# Patient Record
Sex: Female | Born: 1995 | Race: Black or African American | Hispanic: No
Health system: Southern US, Community
[De-identification: ages and names within clinical notes are randomized; demographics above are authoritative.]

---

## 2020-07-16 ENCOUNTER — Other Ambulatory Visit: Payer: Self-pay

## 2020-07-16 ENCOUNTER — Emergency Department (HOSPITAL_COMMUNITY)
Admission: EM | Admit: 2020-07-16 | Discharge: 2020-07-16 | Disposition: A | Discharge status: Home / Self Care | Payer: No Typology Code available for payment source | Attending: Emergency Medicine | Admitting: Emergency Medicine

## 2020-07-16 ENCOUNTER — Emergency Department (HOSPITAL_COMMUNITY): Payer: No Typology Code available for payment source

## 2020-07-16 DIAGNOSIS — J3489 Other specified disorders of nose and nasal sinuses: Secondary | ICD-10-CM | POA: Diagnosis not present

## 2020-07-16 DIAGNOSIS — R6883 Chills (without fever): Secondary | ICD-10-CM | POA: Insufficient documentation

## 2020-07-16 DIAGNOSIS — R531 Weakness: Secondary | ICD-10-CM | POA: Insufficient documentation

## 2020-07-16 DIAGNOSIS — R202 Paresthesia of skin: Secondary | ICD-10-CM | POA: Insufficient documentation

## 2020-07-16 DIAGNOSIS — R519 Headache, unspecified: Secondary | ICD-10-CM | POA: Diagnosis not present

## 2020-07-16 LAB — I-STAT CHEM 8, ED
BUN: 10 mg/dL (ref 6–20)
Calcium, Ion: 1.26 mmol/L (ref 1.15–1.40)
Chloride: 103 mmol/L (ref 98–111)
Creatinine, Ser: 0.7 mg/dL (ref 0.44–1.00)
Glucose, Bld: 89 mg/dL (ref 70–99)
HCT: 42 % (ref 36.0–46.0)
Hemoglobin: 14.3 g/dL (ref 12.0–15.0)
Potassium: 4 mmol/L (ref 3.5–5.1)
Sodium: 140 mmol/L (ref 135–145)
TCO2: 24 mmol/L (ref 22–32)

## 2020-07-16 LAB — I-STAT BETA HCG BLOOD, ED (MC, WL, AP ONLY): I-stat hCG, quantitative: <5 m[IU]/mL (ref <5)

## 2020-07-16 MED ORDER — DIPHENHYDRAMINE HCL 50 MG/ML IJ SOLN
12.5000 mg | Freq: Once | INTRAMUSCULAR | Status: AC
  Administered 2020-07-16: 12.5 mg via INTRAVENOUS
  Filled 2020-07-16: qty 1

## 2020-07-16 MED ORDER — METOCLOPRAMIDE HCL 5 MG/ML IJ SOLN
10.0000 mg | Freq: Once | INTRAMUSCULAR | Status: AC
  Administered 2020-07-16: 10 mg via INTRAVENOUS
  Filled 2020-07-16: qty 2

## 2020-07-16 MED ORDER — DEXAMETHASONE SODIUM PHOSPHATE 10 MG/ML IJ SOLN
10.0000 mg | Freq: Once | INTRAMUSCULAR | Status: AC
  Administered 2020-07-16: 10 mg via INTRAVENOUS
  Filled 2020-07-16: qty 1

## 2020-07-16 MED ORDER — SODIUM CHLORIDE 0.9 % IV BOLUS
1000.0000 mL | Freq: Once | INTRAVENOUS | Status: AC
  Administered 2020-07-16: 1000 mL via INTRAVENOUS

## 2020-07-16 MED ORDER — KETOROLAC TROMETHAMINE 15 MG/ML IJ SOLN
15.0000 mg | Freq: Once | INTRAMUSCULAR | Status: AC
  Administered 2020-07-16: 15 mg via INTRAVENOUS
  Filled 2020-07-16: qty 1

## 2020-07-16 NOTE — ED Notes (Signed)
Patient transported to CT 

## 2020-07-16 NOTE — ED Triage Notes (Addendum)
Per ems pt was at home and has been having bad headache that was behind the left eye. Pt said has had headahes before. Pt said light sensitivity. Pt is nauseated too. Pt said some tingling in both hands

## 2020-07-16 NOTE — Discharge Instructions (Signed)
Please establish care with a primary doctor in the area Return to the ER if you have any worsening symptoms

## 2020-07-16 NOTE — ED Notes (Signed)
Pt. Returned from [place] with her pain in her stomach hurting.

## 2020-07-16 NOTE — ED Provider Notes (Signed)
[facility] EMERGENCY DEPARTMENT Provider Note   CSN: [id number] Arrival date & time: 07/16/20  2683     History Chief Complaint  Patient presents with  . Migraine    Angela Holder is a 24 y.o. female who presents with a headache and R arm weakness and tingling. She states that she has a long history of headaches - she has about 5 a month and usually it's worse with hormonal changes. Her headache woke her up from sleep at 3AM this morning. It's behind her L eye which is typical. She will start to have chills and feel hot. Then she started to see spots in her vision. Her L eye was tearing and she had a runny nose. The new symptom that she had which prompted her to come to the ER was she started to have paresthesias in the finger tips. She called EMS and the tingling progressively worsened and started going up her R arm which scared her. It lasted about an hour and then started to get better. Since she was waiting she had an episode of vomiting which actually made her headache better. Usually she takes Ibuprofen for her headaches but it doesn't help much. She has several family members who suffer from migraines. She is from [place] but going to [employer] for college. She has not seen a neurologist or been formally diagnosed with migraines.  HPI     No past medical history on file.  There are no problems to display for this patient.   The histories are not reviewed yet. Please review them in the "History" navigator section and refresh this SmartLink.   OB History   No obstetric history on file.     No family history on file.  Social History   Tobacco Use  . Smoking status: Not on file  Substance Use Topics  . Alcohol use: Not on file  . Drug use: Not on file    Home Medications Prior to Admission medications   Not on File    Allergies    Patient has no allergy information on record.  Review of Systems   Review of Systems  Constitutional: Positive for  chills. Negative for fever.  HENT: Positive for rhinorrhea. Negative for ear discharge.   Eyes: Positive for discharge.  Neurological: Positive for weakness, numbness and headaches. Negative for dizziness, syncope and facial asymmetry.  All other systems reviewed and are negative.   Physical Exam Updated Vital Signs BP 105/64 (BP Location: Left Arm)   Pulse 76   Temp 98.7 F (37.1 C) (Oral)   Resp 10   SpO2 100%   Physical Exam Vitals and nursing note reviewed.  Constitutional:      General: She is not in acute distress.    Appearance: Normal appearance. She is well-developed. She is not ill-appearing.     Comments: Alert, calm, cooperative. NAD  HENT:     Head: Normocephalic and atraumatic.  Eyes:     General: No scleral icterus.       Right eye: No discharge.        Left eye: No discharge.     Conjunctiva/sclera: Conjunctivae normal.     Pupils: Pupils are equal, round, and reactive to light.  Cardiovascular:     Rate and Rhythm: Normal rate.  Pulmonary:     Effort: Pulmonary effort is normal. No respiratory distress.  Abdominal:     General: There is no distension.  Musculoskeletal:     Cervical back: Normal  range of motion.  Skin:    General: Skin is warm and dry.  Neurological:     Mental Status: She is alert and oriented to person, place, and time.     Comments: Mental Status:  Alert, oriented, thought content appropriate, able to give a coherent history. Speech fluent without evidence of aphasia. Able to follow 2 step commands without difficulty.  Cranial Nerves:  II:  Peripheral visual fields grossly normal, pupils equal, round, reactive to light III,IV, VI: ptosis not present, extra-ocular motions intact bilaterally  V,VII: smile symmetric, facial light touch sensation equal VIII: hearing grossly normal to voice  X: uvula elevates symmetrically  XI: bilateral shoulder shrug symmetric and strong XII: midline tongue extension without fassiculations Motor:    Normal tone. 5/5 in LUE and LLE. 4/5 in RUE and 5/5 in RLE Sensory: Subjective decreased sensation of the middle and pinky finger  Cerebellar: normal finger-to-nose with bilateral upper extremities Gait: normal gait and balance CV: distal pulses palpable throughout    Psychiatric:        Behavior: Behavior normal.     ED Results / Procedures / Treatments   Labs (all labs ordered are listed, but only abnormal results are displayed) Labs Reviewed  I-STAT CHEM 8, ED  I-STAT BETA HCG BLOOD, ED ([facility], [facility], [facility] ONLY)    EKG None  Radiology CT Head Wo Contrast  Result Date: 07/16/2020 CLINICAL DATA:  Headache behind left eye. Possible stroke. Photosensitivity. Nausea. EXAM: CT HEAD WITHOUT CONTRAST TECHNIQUE: Contiguous axial images were obtained from the base of the skull through the vertex without intravenous contrast. COMPARISON:  None. FINDINGS: Brain: No evidence of acute infarction, hemorrhage, hydrocephalus, extra-axial collection or mass lesion/mass effect. Vascular: No hyperdense vessel or unexpected calcification. Skull: Normal. Negative for fracture or focal lesion. Sinuses/Orbits: No acute finding. Other: None. IMPRESSION: Normal head CT. Electronically Signed   By: Elberta Fortis M.D.   On: 07/16/2020 13:37    Procedures Procedures (including critical care time)  Medications Ordered in ED Medications  ketorolac (TORADOL) 15 MG/ML injection 15 mg (15 mg Intravenous Given 07/16/20 1128)  sodium chloride 0.9 % bolus 1,000 mL (0 mLs Intravenous Stopped 07/16/20 1355)  diphenhydrAMINE (BENADRYL) injection 12.5 mg (12.5 mg Intravenous Given 07/16/20 1127)  metoCLOPramide (REGLAN) injection 10 mg (10 mg Intravenous Given 07/16/20 1127)  dexamethasone (DECADRON) injection 10 mg (10 mg Intravenous Given 07/16/20 1128)    ED Course  I have reviewed the triage vital signs and the nursing notes.  Pertinent labs & imaging results that were available during my care of the patient were  reviewed by me and considered in my medical decision making (see chart for details).  24 year old female presents with a "migraine" headache since 3AM this morning. Her vitals are normal. Neurologic exam is remarkable for mild RUE weakness which she states she hasn't had before with prior headaches. Question complex migraine vs cluster headache. Will obtain CT head and give migraine cocktail and reassess.  CT head is negative. After medications, she feels much better and back to baseline. Advised to establish care with a provider in the area to help with better control of her headaches. She verbalized understanding.  MDM Rules/Calculators/A&P                           Final Clinical Impression(s) / ED Diagnoses Final diagnoses:  Bad headache    Rx / DC Orders ED Discharge Orders  None       Bethel Born, PA-C 07/17/20 0901    Geoffery Lyons, MD 07/17/20 [phone]

## 2021-10-25 IMAGING — CT CT HEAD W/O CM
3 of 4 series · 17 of 40 positions shown, 19 images · non-contrast
Comparison: None.

CLINICAL DATA: Headache behind left eye. Possible stroke.
Photosensitivity. Nausea.

EXAM:
CT HEAD WITHOUT CONTRAST
TECHNIQUE: Contiguous axial images were obtained from the base of the skull
through the vertex without intravenous contrast.

[Series 3: head without · axial · non-contrast · 0.40mm/px · z∈[-67,+38]mm · 7 of 29 slices shown, 9 images]
[im 4/29  brain]
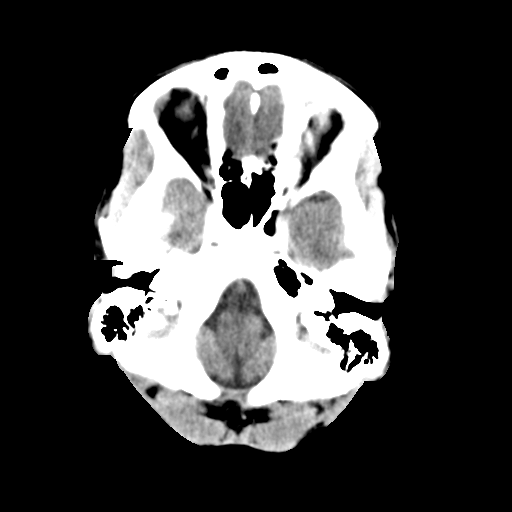
[im 4/29  bone]
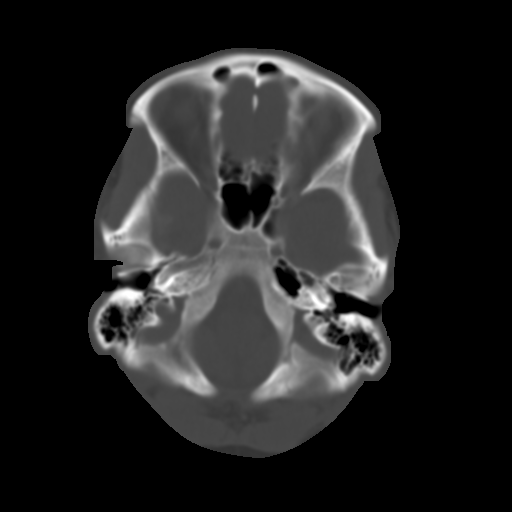
[im 8/29  brain]
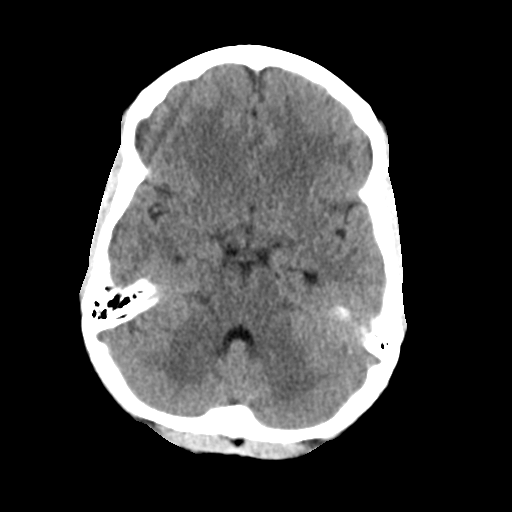
[im 11/29  brain]
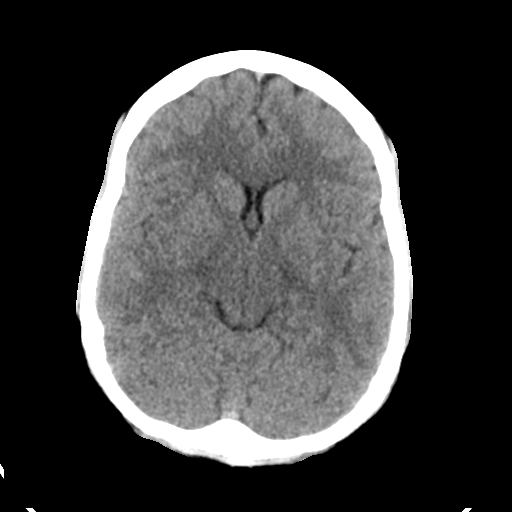
[im 15/29  brain]
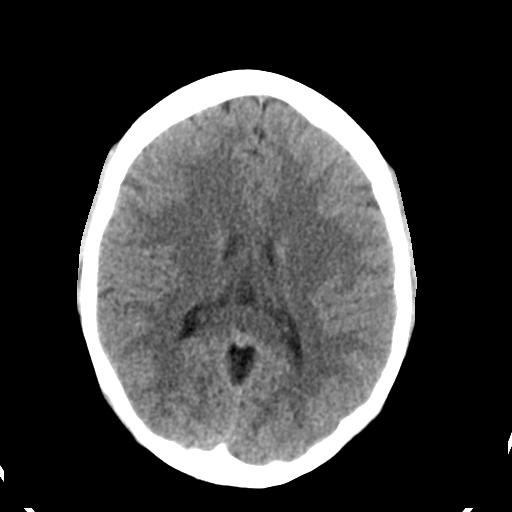
[im 18/29  brain]
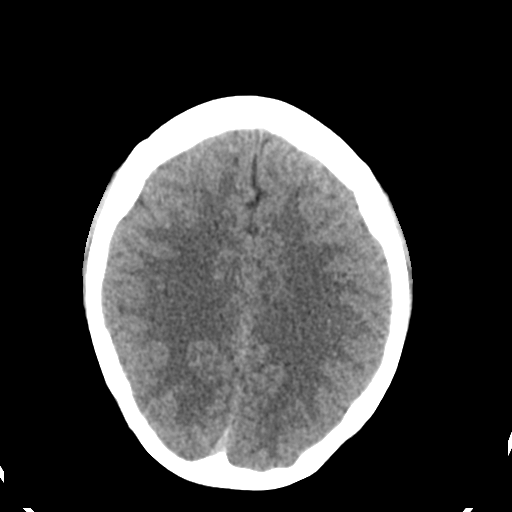
[im 18/29  bone]
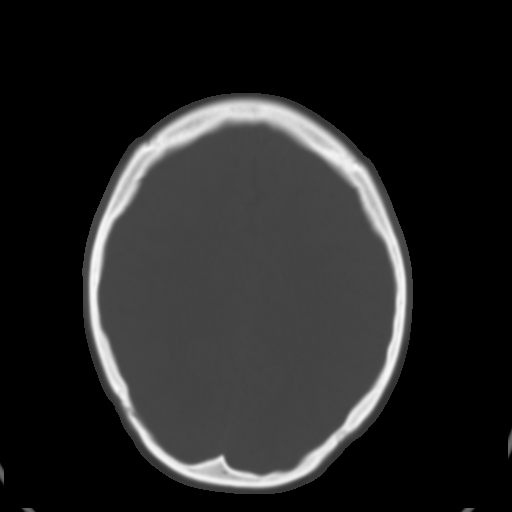
[im 22/29  brain]
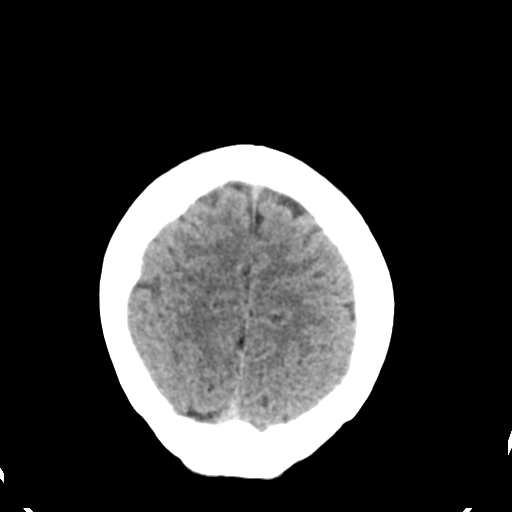
[im 25/29  brain]
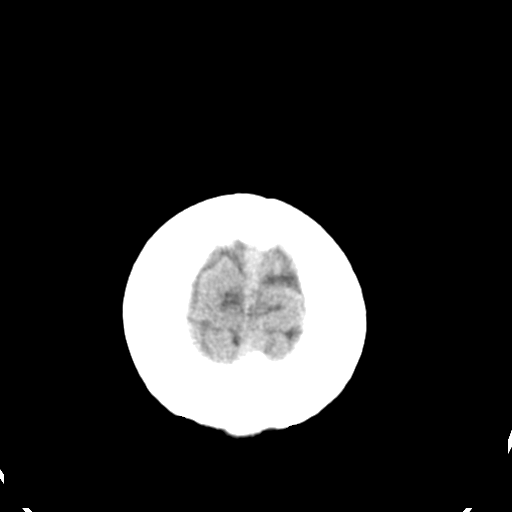

[Series 4: head bone · axial · 0.40mm/px · z∈[-68,+32]mm · 7 of 73 slices shown]
[im 8/73  bone]
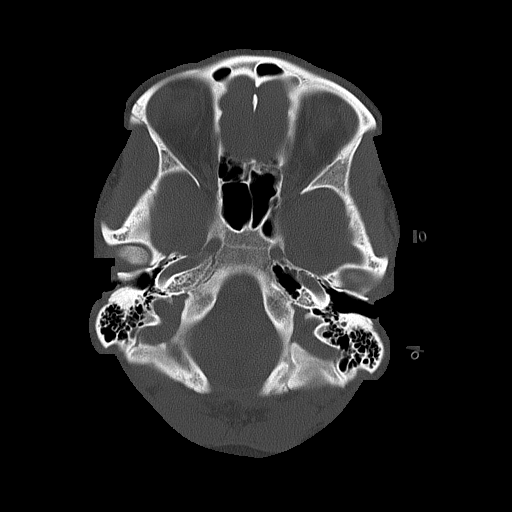
[im 15/73  bone]
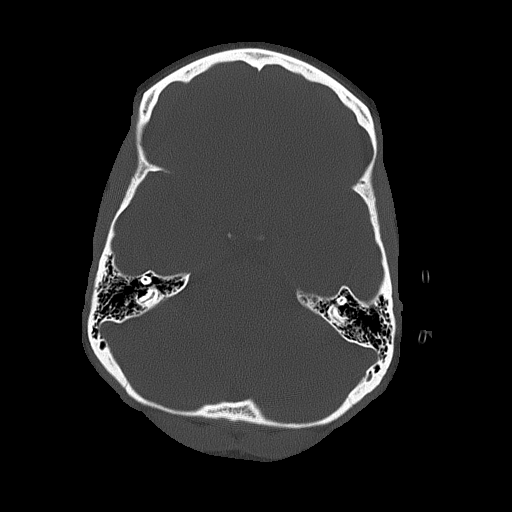
[im 22/73  bone]
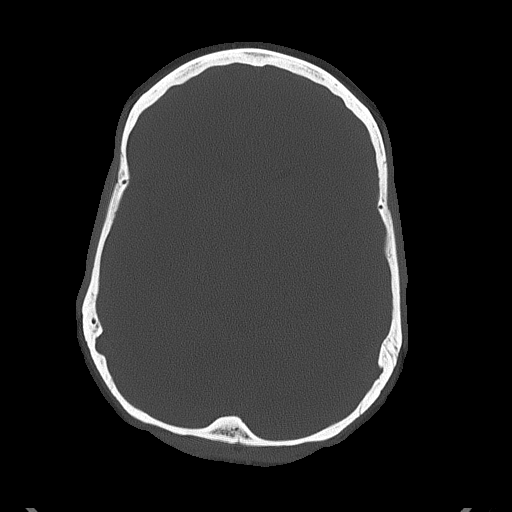
[im 33/73  bone]
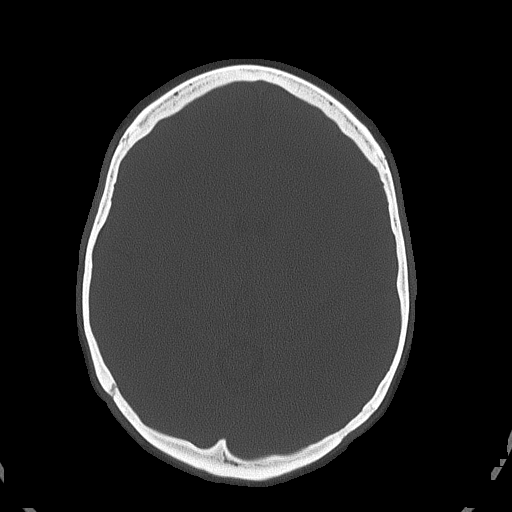
[im 40/73  bone]
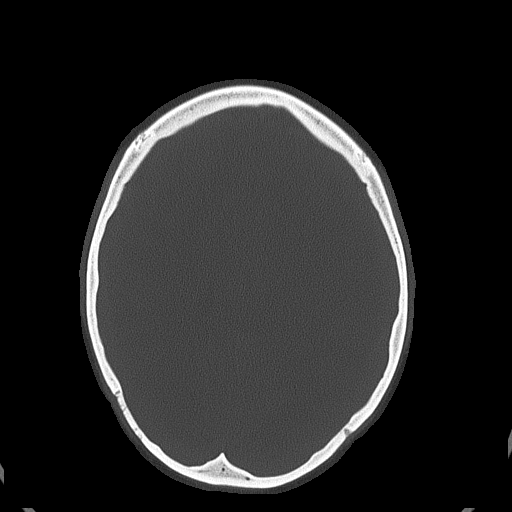
[im 51/73  bone]
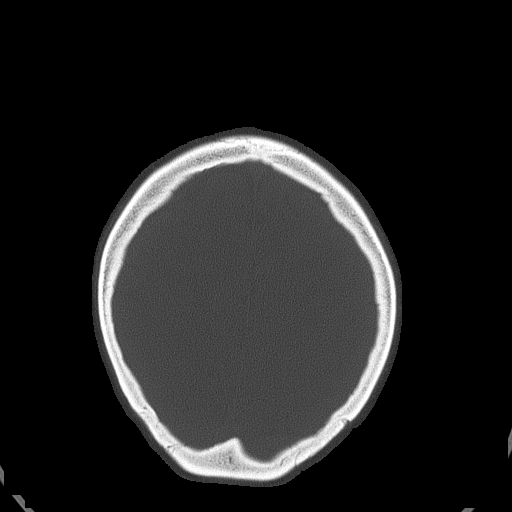
[im 58/73  bone]
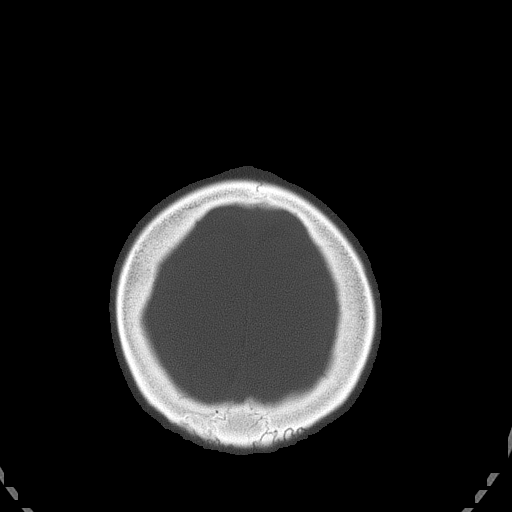

[Series 5: head without cor · coronal · non-contrast · 0.28mm/px · 3 of 67 slices shown]
[im 17/67  brain]
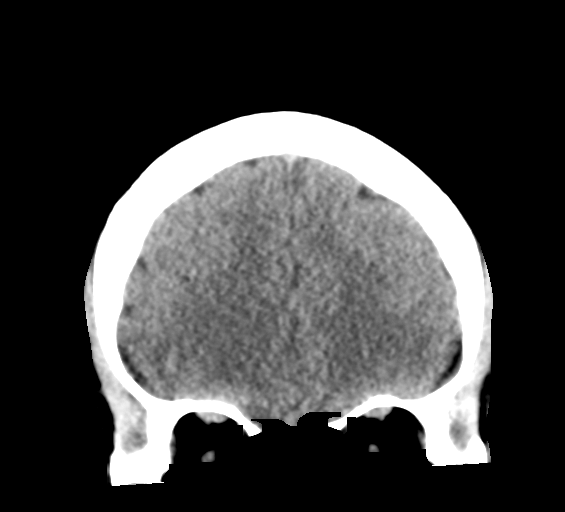
[im 34/67  brain]
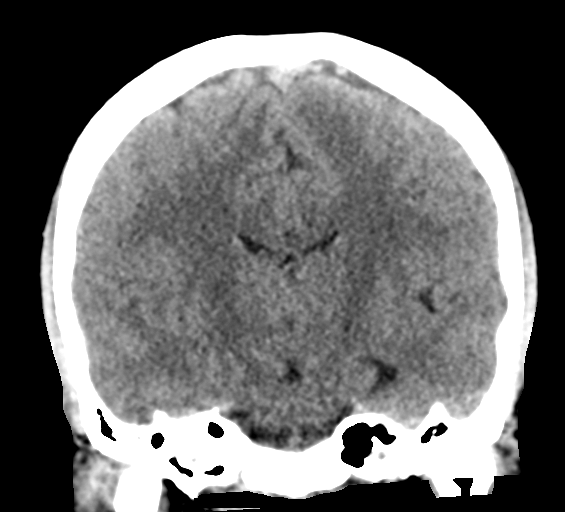
[im 50/67  brain]
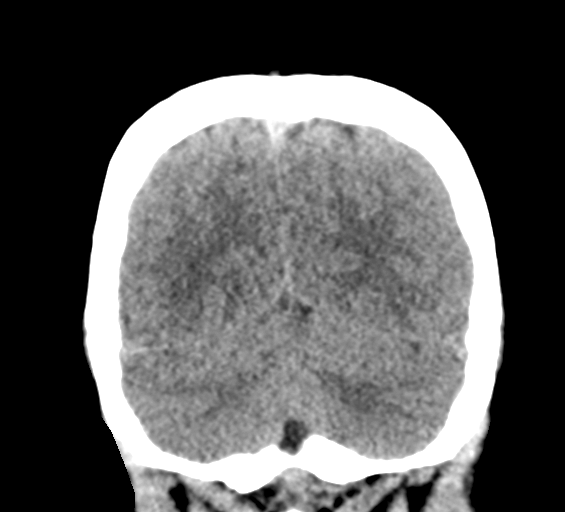

[17 of 40 positions shown; findings below may reference images not displayed]

FINDINGS: Brain: No evidence of acute infarction, hemorrhage, hydrocephalus,
extra-axial collection or mass lesion/mass effect.

Vascular: No hyperdense vessel or unexpected calcification.

Skull: Normal. Negative for fracture or focal lesion.

Sinuses/Orbits: No acute finding.

Other: None.
IMPRESSION: Normal head CT.
